# Patient Record
Sex: Female | Born: 1950 | Race: White | Hispanic: No | Marital: Married | Smoking: Never smoker
Health system: Southern US, Community
[De-identification: ages and names within clinical notes are randomized; demographics above are authoritative.]

## PROBLEM LIST (undated history)

## (undated) HISTORY — PX: DENTAL SURGERY: SHX609

---

## 2011-06-15 ENCOUNTER — Ambulatory Visit: Payer: Self-pay | Admitting: Family Medicine

## 2011-06-16 ENCOUNTER — Ambulatory Visit: Payer: Self-pay | Admitting: Family Medicine

## 2011-07-26 ENCOUNTER — Ambulatory Visit: Payer: Self-pay | Admitting: Gastroenterology

## 2013-03-05 ENCOUNTER — Emergency Department: Payer: Self-pay | Admitting: Emergency Medicine

## 2013-03-05 LAB — BASIC METABOLIC PANEL
BUN: 21 mg/dL — ABNORMAL HIGH (ref 7–18)
Chloride: 108 mmol/L — ABNORMAL HIGH (ref 98–107)
EGFR (Non-African Amer.): >60
Osmolality: 285 (ref 275–301)
Potassium: 3.2 mmol/L — ABNORMAL LOW (ref 3.5–5.1)
Sodium: 141 mmol/L (ref 136–145)

## 2013-03-05 LAB — CBC
HGB: 13.2 g/dL (ref 12.0–16.0)
MCH: 31.7 pg (ref 26.0–34.0)
MCHC: 34.5 g/dL (ref 32.0–36.0)
WBC: 7.4 10*3/uL (ref 3.6–11.0)

## 2013-03-05 LAB — TROPONIN I: Troponin-I: <0.02 ng/mL

## 2013-03-05 LAB — CK: CK, Total: 145 U/L (ref 21–215)

## 2013-03-06 LAB — URINALYSIS, COMPLETE
Ketone: NEGATIVE
Leukocyte Esterase: NEGATIVE
Ph: 5 (ref 4.5–8.0)
RBC,UR: <1 /HPF (ref 0–5)
WBC UR: 3 /HPF (ref 0–5)

## 2014-08-13 ENCOUNTER — Ambulatory Visit: Payer: Self-pay | Admitting: Family Medicine

## 2017-10-24 ENCOUNTER — Ambulatory Visit: Admission: RE | Admit: 2017-10-24 | Payer: Medicare HMO | Source: Ambulatory Visit | Admitting: Gastroenterology

## 2017-10-24 ENCOUNTER — Encounter: Admission: RE | Payer: Self-pay | Source: Ambulatory Visit

## 2017-10-24 SURGERY — COLONOSCOPY WITH PROPOFOL
Anesthesia: General

## 2018-03-29 ENCOUNTER — Other Ambulatory Visit: Payer: Self-pay | Admitting: Family Medicine

## 2018-03-29 DIAGNOSIS — Z1239 Encounter for other screening for malignant neoplasm of breast: Secondary | ICD-10-CM

## 2020-08-01 ENCOUNTER — Encounter: Payer: Self-pay | Admitting: Emergency Medicine

## 2020-08-01 ENCOUNTER — Other Ambulatory Visit: Payer: Self-pay

## 2020-08-01 ENCOUNTER — Emergency Department: Payer: Medicare HMO

## 2020-08-01 DIAGNOSIS — U071 COVID-19: Secondary | ICD-10-CM | POA: Insufficient documentation

## 2020-08-01 DIAGNOSIS — R05 Cough: Secondary | ICD-10-CM | POA: Diagnosis present

## 2020-08-01 DIAGNOSIS — J1282 Pneumonia due to coronavirus disease 2019: Secondary | ICD-10-CM | POA: Insufficient documentation

## 2020-08-01 DIAGNOSIS — E876 Hypokalemia: Secondary | ICD-10-CM | POA: Insufficient documentation

## 2020-08-01 LAB — BASIC METABOLIC PANEL
Anion gap: 14 (ref 5–15)
BUN: 23 mg/dL (ref 8–23)
CO2: 23 mmol/L (ref 22–32)
Calcium: 8.9 mg/dL (ref 8.9–10.3)
Chloride: 101 mmol/L (ref 98–111)
Creatinine, Ser: 0.76 mg/dL (ref 0.44–1.00)
GFR calc Af Amer: >60 mL/min (ref >60)
GFR calc non Af Amer: >60 mL/min (ref >60)
Glucose, Bld: 130 mg/dL — ABNORMAL HIGH (ref 70–99)
Potassium: 3.4 mmol/L — ABNORMAL LOW (ref 3.5–5.1)
Sodium: 138 mmol/L (ref 135–145)

## 2020-08-01 LAB — CBC
HCT: 41.8 % (ref 36.0–46.0)
Hemoglobin: 14.5 g/dL (ref 12.0–15.0)
MCH: 30 pg (ref 26.0–34.0)
MCHC: 34.7 g/dL (ref 30.0–36.0)
MCV: 86.4 fL (ref 80.0–100.0)
Platelets: 141 10*3/uL — ABNORMAL LOW (ref 150–400)
RBC: 4.84 MIL/uL (ref 3.87–5.11)
RDW: 12.5 % (ref 11.5–15.5)
WBC: 4.1 10*3/uL (ref 4.0–10.5)
nRBC: 0 % (ref 0.0–0.2)

## 2020-08-01 LAB — TROPONIN I (HIGH SENSITIVITY): Troponin I (High Sensitivity): 7 ng/L (ref <18)

## 2020-08-01 NOTE — ED Triage Notes (Signed)
Patient states that she tested positive for Covid on Tuesday. Patient with complaint of chest pain and body aches that started on Monday.

## 2020-08-02 ENCOUNTER — Emergency Department
Admission: EM | Admit: 2020-08-02 | Discharge: 2020-08-02 | Disposition: A | Discharge status: Home / Self Care | Payer: Medicare HMO | Attending: Emergency Medicine | Admitting: Emergency Medicine

## 2020-08-02 ENCOUNTER — Other Ambulatory Visit: Payer: Self-pay

## 2020-08-02 DIAGNOSIS — J189 Pneumonia, unspecified organism: Secondary | ICD-10-CM

## 2020-08-02 DIAGNOSIS — U071 COVID-19: Secondary | ICD-10-CM

## 2020-08-02 DIAGNOSIS — E876 Hypokalemia: Secondary | ICD-10-CM

## 2020-08-02 MED ORDER — ACETAMINOPHEN 500 MG PO TABS
1000.0000 mg | ORAL_TABLET | Freq: Once | ORAL | Status: AC
  Administered 2020-08-02: 1000 mg via ORAL
  Filled 2020-08-02: qty 2

## 2020-08-02 MED ORDER — POTASSIUM CHLORIDE ER 10 MEQ PO CPCR: 10.0000 meq | ORAL_CAPSULE | Freq: Every day | ORAL | 0 refills | Status: AC

## 2020-08-02 MED ORDER — PREDNISONE 10 MG PO TABS: ORAL_TABLET | ORAL | 0 refills | Status: AC

## 2020-08-02 MED ORDER — AZITHROMYCIN 250 MG PO TABS: ORAL_TABLET | ORAL | 0 refills | Status: AC

## 2020-08-02 NOTE — ED Notes (Signed)
See triage note  Presents with cough and body aches  States her sxs' started on Monday  She tested positive for COVID weds..states she felt dehydrated but since arrival to ED she has been able to drink  States she did fell better  But now the cough is back

## 2020-08-02 NOTE — Discharge Instructions (Addendum)
Follow-up with your primary care provider if any continued problems or concerns.  Begin taking Zithromax which is an antibiotic that should help prevent a bacterial pneumonia.  Currently you have a pneumonia that most likely is related to your Covid virus.  Drink fluids frequently and stay hydrated.  Tylenol or ibuprofen as needed for muscle aches, headache or fever.  The prednisone should help with coughing.  A steroid taper was sent to the pharmacy starting with 6 tablets today and tapering down to 1.  A prescription for Micro-K which is potassium is once a day for the next 5 days.  Try to eat foods that are rich in potassium.  Return to the emergency department immediately if any sudden worsening of your breathing such as shortness of breath or difficulty breathing.

## 2020-08-02 NOTE — ED Provider Notes (Signed)
[facility] Emergency Department Provider Note   ____________________________________________   First MD Initiated Contact with Patient 08/02/20 [phone]     (approximate)  I have reviewed the triage vital signs and the nursing notes.   HISTORY  Chief Complaint Generalized Body Aches and Chest Pain   HPI Darlene Allen is a 69 y.o. female presents to the ED with complaint of cough, body aches and feeling dehydrated.  Patient states she has been able to drink fluids and denies any vomiting.  Patient states that the cough is causing her to gag which then causes her to vomit.  She tested +3 days ago however patient states that her symptoms started approximately 5 days ago.  She states that since being in the emergency department she has been feeling better.  Rates her pain as an 8 out of 10.      History reviewed. No pertinent past medical history.  There are no problems to display for this patient.   Past Surgical History:  Procedure Laterality Date  . DENTAL SURGERY      Prior to Admission medications   Medication Sig Start Date End Date Taking? Authorizing Provider  azithromycin (ZITHROMAX Z-PAK) 250 MG tablet Take 2 tablets (500 mg) on  Day 1,  followed by 1 tablet (250 mg) once daily on Days 2 through 5. 08/02/20   Bridget Hartshorn L, PA-C  potassium chloride (MICRO-K) 10 MEQ CR capsule Take 1 capsule (10 mEq total) by mouth daily. 08/02/20   Tommi Rumps, PA-C  predniSONE (DELTASONE) 10 MG tablet Take 6 tablets  today, on day 2 take 5 tablets, day 3 take 4 tablets, day 4 take 3 tablets, day 5 take  2 tablets and 1 tablet the last day 08/02/20   Tommi Rumps, PA-C    Allergies Mango butter  No family history on file.  Social History Social History   Tobacco Use  . Smoking status: Never Smoker  . Smokeless tobacco: Never Used  Substance Use Topics  . Alcohol use: Yes  . Drug use: Never    Review of Systems Constitutional:  Subjective fever/chills Eyes: No visual changes. ENT: No sore throat. Cardiovascular: Denies chest pain. Respiratory: Denies shortness of breath.  Positive for cough. Gastrointestinal: No abdominal pain.  No nausea, no vomiting.  No diarrhea.  Genitourinary: Negative for dysuria. Musculoskeletal: Positive body aches. Skin: Negative for rash. Neurological: Negative for headaches, focal weakness or numbness.  ____________________________________________   PHYSICAL EXAM:  VITAL SIGNS: ED Triage Vitals  Enc Vitals Group     BP 08/01/20 2113 (!) 105/49     Pulse Rate 08/01/20 2113 79     Resp 08/01/20 2113 18     Temp 08/01/20 2113 98.5 F (36.9 C)     Temp Source 08/01/20 2113 Oral     SpO2 08/01/20 2113 95 %     Weight 08/01/20 2114 158 lb (71.7 kg)     Height 08/01/20 2114 5' 4.5" (1.638 m)     Head Circumference --      Peak Flow --      Pain Score 08/01/20 2114 8     Pain Loc --      Pain Edu? --      Excl. in GC? --     Constitutional: Alert and oriented. Well appearing and in no acute distress. Eyes: Conjunctivae are normal.  Head: Atraumatic. Nose: Mild congestion/rhinnorhea. Mouth/Throat: Mucous membranes are moist.  Oropharynx non-erythematous. Neck: No stridor.  Cardiovascular: Normal rate, regular rhythm. Grossly normal heart sounds.  Good peripheral circulation. Respiratory: Normal respiratory effort.  No retractions. Lungs CTAB.  No coughing was noted.  Physical exam. Gastrointestinal: Soft and nontender. No distention. Musculoskeletal: Moves upper and lower extremities any difficulty.  Normal gait was noted. Neurologic:  Normal speech and language. No gross focal neurologic deficits are appreciated. No gait instability. Skin:  Skin is warm, dry and intact.  Psychiatric: Mood and affect are normal. Speech and behavior are normal.  ____________________________________________   LABS (all labs ordered are listed, but only abnormal results are  displayed)  Labs Reviewed  BASIC METABOLIC PANEL - Abnormal; Notable for the following components:      Result Value   Potassium 3.4 (*)    Glucose, Bld 130 (*)    All other components within normal limits  CBC - Abnormal; Notable for the following components:   Platelets 141 (*)    All other components within normal limits  TROPONIN I (HIGH SENSITIVITY)  TROPONIN I (HIGH SENSITIVITY)   ____________________________________________  RADIOLOGY   Official radiology report(s): DG Chest 2 View  Result Date: 08/01/2020 CLINICAL DATA:  Chest pain EXAM: CHEST - 2 VIEW COMPARISON:  None. FINDINGS: Airspace disease within the lingula and left greater than right lung base. No pleural effusion. Normal heart size. No pneumothorax. Aortic atherosclerosis IMPRESSION: Airspace disease within the lingula and left greater than right lung base, consistent with pneumonia. Electronically Signed   By: Jasmine Pang M.D.   On: 08/01/2020 22:00    ____________________________________________   PROCEDURES  Procedure(s) performed (including Critical Care):  Procedures   ____________________________________________   INITIAL IMPRESSION / ASSESSMENT AND PLAN / ED COURSE  As part of my medical decision making, I reviewed the following data within the electronic MEDICAL RECORD NUMBER Notes from prior ED visits and [place] Controlled Substance Database  [name] was evaluated in Emergency Department on 08/02/2020 for the symptoms described in the history of present illness. She was evaluated in the context of the global COVID-19 pandemic, which necessitated consideration that the patient might be at risk for infection with the SARS-CoV-2 virus that causes COVID-19. Institutional protocols and algorithms that pertain to the evaluation of patients at risk for COVID-19 are in a state of rapid change based on information released by regulatory bodies including the CDC and federal and state organizations.  These policies and algorithms were followed during the patient's care in the ED.  69 year old female presents to the ED with complaint of continued body aches, chest pain and cough.  Patient tested positive for Covid 4 days ago.  Vital signs are stable and patient is afebrile while in the ED.  Chest x-ray shows left lower lobe pneumonia most likely related to Covid.  Patient was placed on prednisone, Zithromax and Micro-K 10 mEq as she was mildly hypokalemic.  Patient is encouraged to follow-up with her primary care provider if any continued problems or concerns.  She also knows to return to the emergency department if any severe worsening of her breathing or shortness of breath.  ____________________________________________   FINAL CLINICAL IMPRESSION(S) / ED DIAGNOSES  Final diagnoses:  Pneumonia of left lower lobe due to infectious organism  COVID-19 virus infection  Hypokalemia     ED Discharge Orders         Ordered    predniSONE (DELTASONE) 10 MG tablet        08/02/20 0804    azithromycin (ZITHROMAX Z-PAK) 250 MG tablet  08/02/20 0804    potassium chloride (MICRO-K) 10 MEQ CR capsule  Daily        08/02/20 0804           Note:  This document was prepared using Dragon voice recognition software and may include unintentional dictation errors.    Tommi Rumps, PA-C 08/02/20 1506    [place], MD 08/02/20 [phone]

## 2021-11-19 ENCOUNTER — Other Ambulatory Visit: Payer: Self-pay | Admitting: Family Medicine

## 2021-11-19 DIAGNOSIS — Z1231 Encounter for screening mammogram for malignant neoplasm of breast: Secondary | ICD-10-CM

## 2021-12-17 ENCOUNTER — Other Ambulatory Visit: Payer: Self-pay

## 2021-12-17 ENCOUNTER — Ambulatory Visit
Admission: RE | Admit: 2021-12-17 | Discharge: 2021-12-17 | Disposition: A | Discharge status: Home / Self Care | Payer: Medicare HMO | Source: Ambulatory Visit | Attending: Family Medicine | Admitting: Family Medicine

## 2021-12-17 DIAGNOSIS — Z1231 Encounter for screening mammogram for malignant neoplasm of breast: Secondary | ICD-10-CM | POA: Diagnosis present

## 2022-05-20 IMAGING — MG MM DIGITAL SCREENING BILAT W/ TOMO AND CAD
8 series · 8 of 24 positions shown · non-contrast
Comparison: Previous exam(s).

CLINICAL DATA: Screening.

EXAM:
DIGITAL SCREENING BILATERAL MAMMOGRAM WITH TOMOSYNTHESIS AND CAD
TECHNIQUE: Bilateral screening digital craniocaudal and mediolateral oblique
mammograms were obtained. Bilateral screening digital breast
tomosynthesis was performed. The images were evaluated with
computer-aided detection.

[R MLO synth-2D]
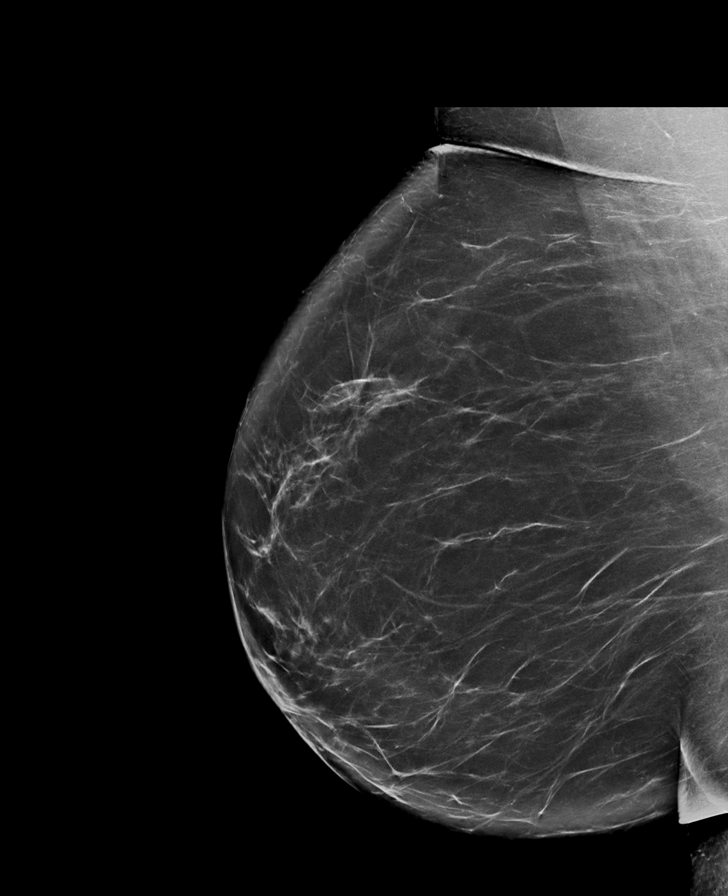

[R CC synth-2D]
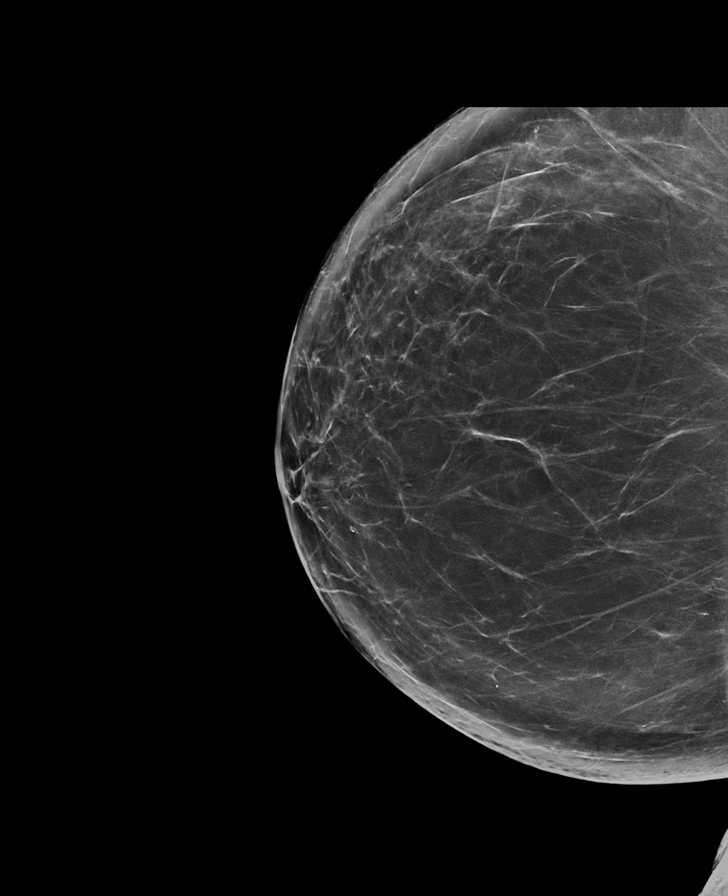

[L MLO synth-2D]
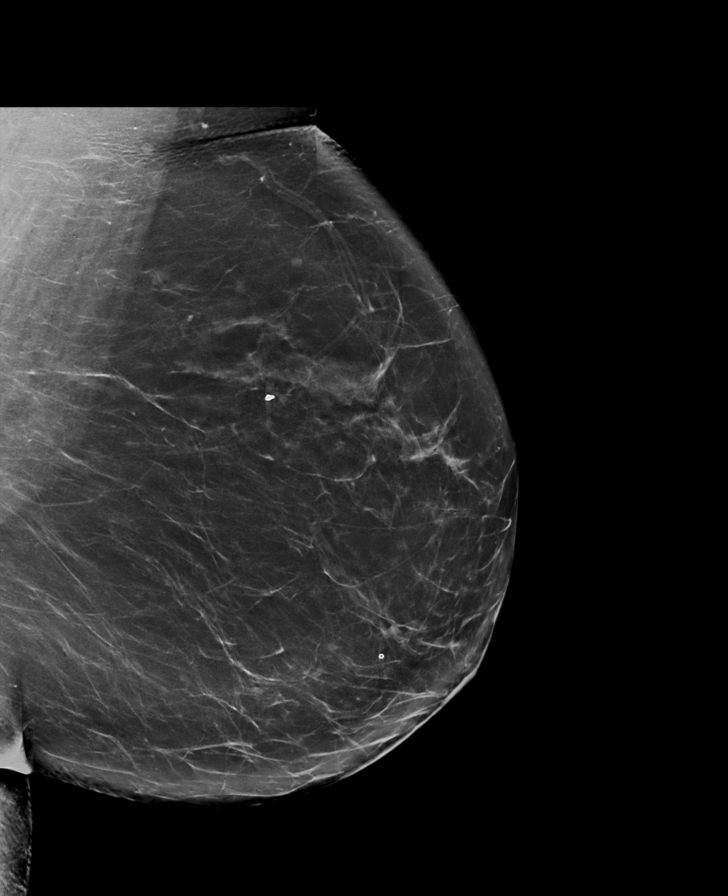

[L CC synth-2D]
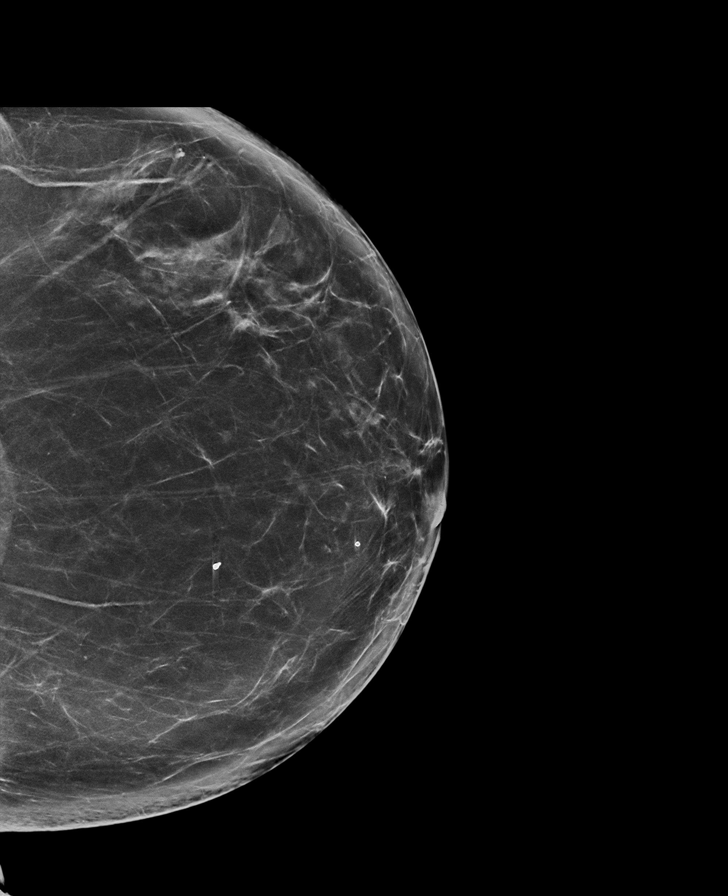

[R MLO tomo · tomo slice 49/96.0]
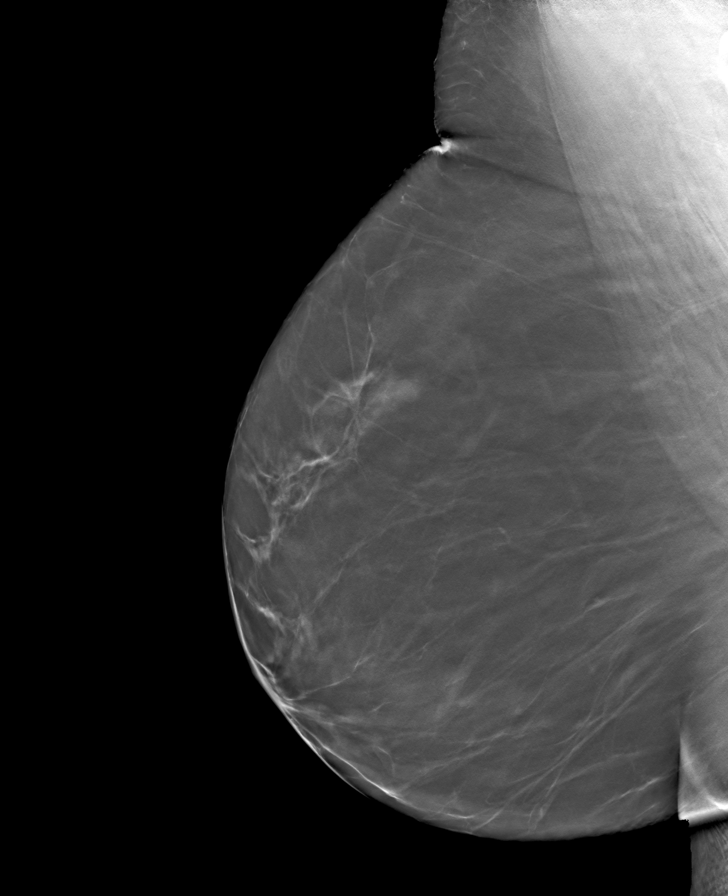

[L MLO tomo · tomo slice 47/94.0]
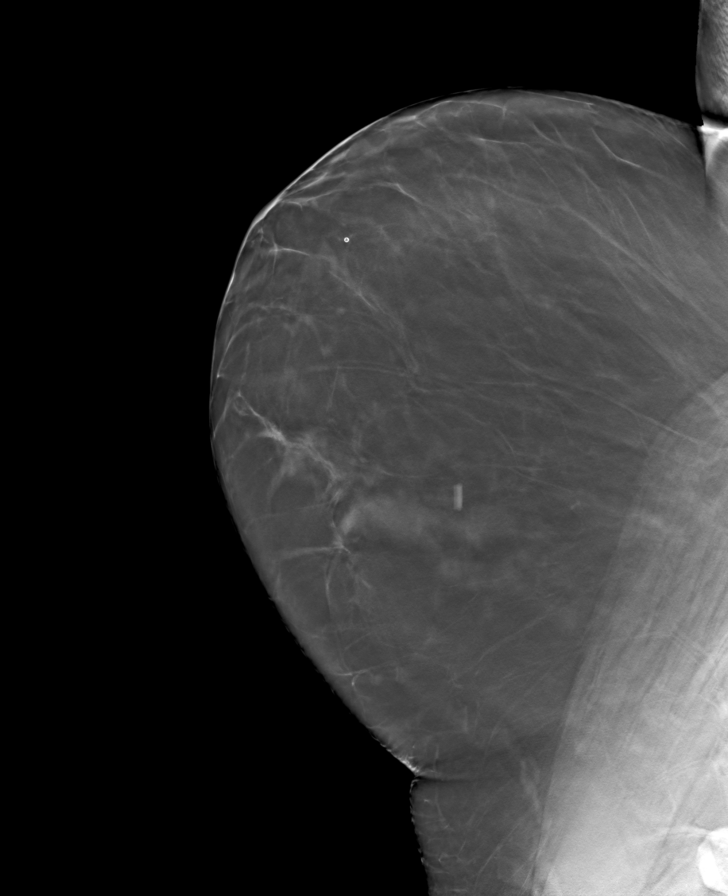

[L CC tomo · tomo slice 43/85.0]
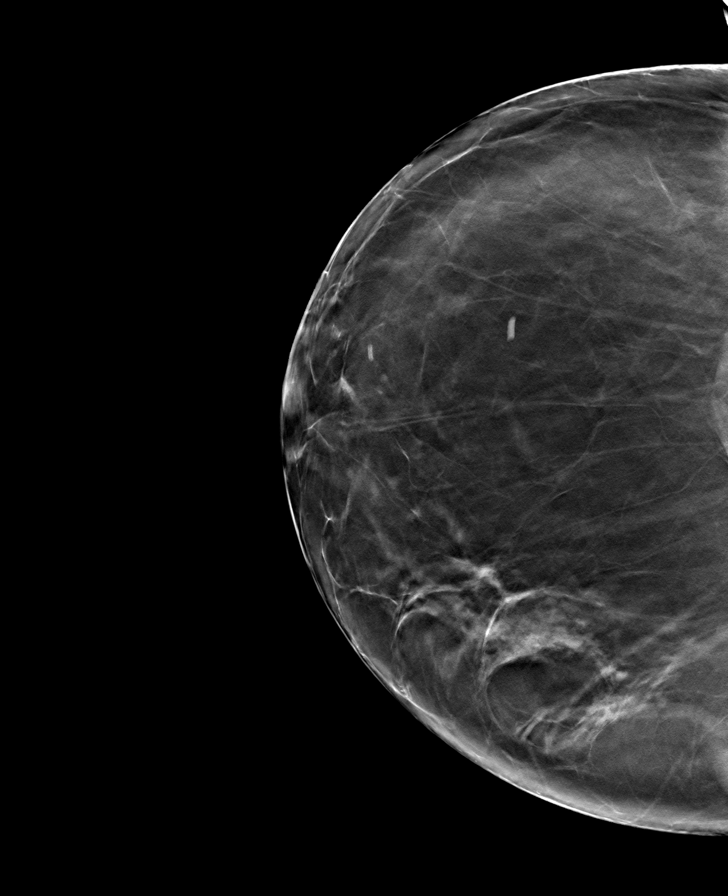

[R CC tomo · tomo slice 44/87.0]
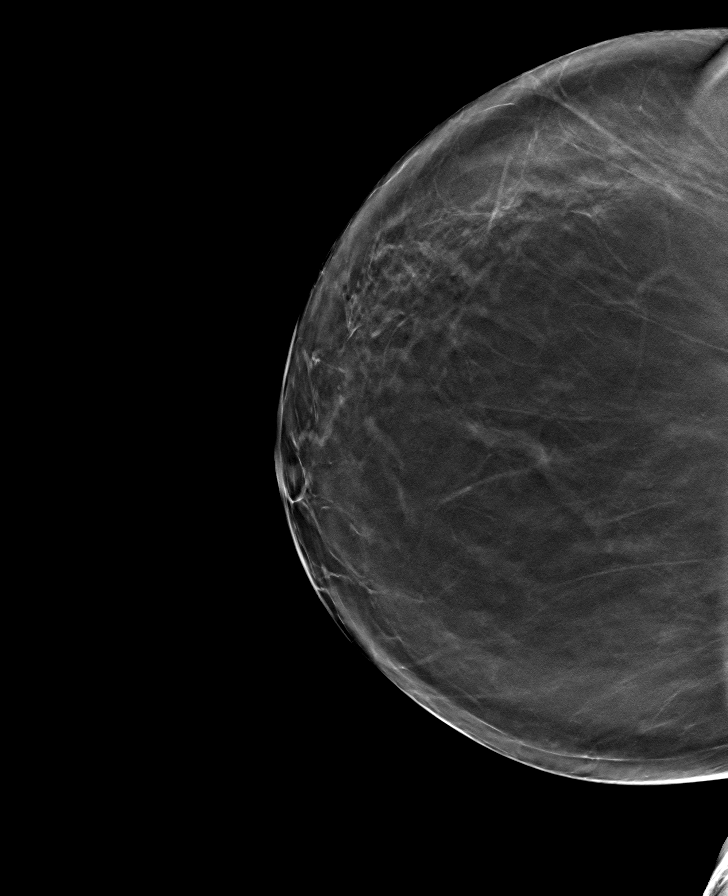

[8 of 24 positions shown; findings below may reference images not displayed]

ACR Breast Density Category b: There are scattered areas of
fibroglandular density.
FINDINGS: There are no findings suspicious for malignancy.
IMPRESSION: No mammographic evidence of malignancy. A result letter of this
screening mammogram will be mailed directly to the patient.

RECOMMENDATION:
Screening mammogram in one year. (Code:51-O-LD2)

BI-RADS CATEGORY  1: Negative.
